# Patient Record
Sex: Female | Born: 2000 | Race: White | Hispanic: No | Marital: Single | Smoking: Never smoker
Health system: Southern US, Community
[De-identification: ages and names within clinical notes are randomized; demographics above are authoritative.]

## PROBLEM LIST (undated history)

## (undated) DIAGNOSIS — F419 Anxiety disorder, unspecified: Secondary | ICD-10-CM

## (undated) DIAGNOSIS — F909 Attention-deficit hyperactivity disorder, unspecified type: Secondary | ICD-10-CM

## (undated) HISTORY — DX: Attention-deficit hyperactivity disorder, unspecified type: F90.9

## (undated) HISTORY — DX: Anxiety disorder, unspecified: F41.9

## (undated) HISTORY — PX: IUD REMOVAL: SHX5392

---
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
## 2018-03-08 ENCOUNTER — Other Ambulatory Visit: Payer: Self-pay | Admitting: Obstetrics and Gynecology

## 2018-03-08 DIAGNOSIS — Z30011 Encounter for initial prescription of contraceptive pills: Secondary | ICD-10-CM

## 2018-06-25 ENCOUNTER — Other Ambulatory Visit: Payer: Self-pay | Admitting: Obstetrics and Gynecology

## 2018-06-25 DIAGNOSIS — Z30011 Encounter for initial prescription of contraceptive pills: Secondary | ICD-10-CM

## 2019-01-03 ENCOUNTER — Emergency Department (HOSPITAL_COMMUNITY)
Admission: EM | Admit: 2019-01-03 | Discharge: 2019-01-03 | Disposition: A | Discharge status: Home / Self Care | Payer: BC Managed Care – PPO | Attending: Emergency Medicine | Admitting: Emergency Medicine

## 2019-01-03 ENCOUNTER — Encounter (HOSPITAL_COMMUNITY): Payer: Self-pay

## 2019-01-03 ENCOUNTER — Other Ambulatory Visit: Payer: Self-pay

## 2019-01-03 ENCOUNTER — Telehealth: Payer: Self-pay | Admitting: Obstetrics and Gynecology

## 2019-01-03 DIAGNOSIS — R102 Pelvic and perineal pain: Secondary | ICD-10-CM | POA: Insufficient documentation

## 2019-01-03 DIAGNOSIS — N939 Abnormal uterine and vaginal bleeding, unspecified: Secondary | ICD-10-CM | POA: Diagnosis not present

## 2019-01-03 LAB — URINALYSIS, ROUTINE W REFLEX MICROSCOPIC
Bilirubin Urine: NEGATIVE
Glucose, UA: NEGATIVE mg/dL
Hgb urine dipstick: NEGATIVE
Ketones, ur: NEGATIVE mg/dL
Leukocytes,Ua: NEGATIVE
Nitrite: NEGATIVE
Protein, ur: NEGATIVE mg/dL
Specific Gravity, Urine: 1.006 (ref 1.005–1.030)
pH: 6 (ref 5.0–8.0)

## 2019-01-03 LAB — BASIC METABOLIC PANEL
Anion gap: 9 (ref 5–15)
BUN: 8 mg/dL (ref 6–20)
CO2: 24 mmol/L (ref 22–32)
Calcium: 9 mg/dL (ref 8.9–10.3)
Chloride: 102 mmol/L (ref 98–111)
Creatinine, Ser: 0.74 mg/dL (ref 0.44–1.00)
GFR calc Af Amer: >60 mL/min (ref >60)
GFR calc non Af Amer: >60 mL/min (ref >60)
Glucose, Bld: 85 mg/dL (ref 70–99)
Potassium: 3.4 mmol/L — ABNORMAL LOW (ref 3.5–5.1)
Sodium: 135 mmol/L (ref 135–145)

## 2019-01-03 LAB — CBC WITH DIFFERENTIAL/PLATELET
Abs Immature Granulocytes: 0.01 10*3/uL (ref 0.00–0.07)
Basophils Absolute: 0 10*3/uL (ref 0.0–0.1)
Basophils Relative: 1 %
Eosinophils Absolute: 0.3 10*3/uL (ref 0.0–0.5)
Eosinophils Relative: 4 %
HCT: 37.9 % (ref 36.0–46.0)
Hemoglobin: 12.8 g/dL (ref 12.0–15.0)
Immature Granulocytes: 0 %
Lymphocytes Relative: 28 %
Lymphs Abs: 2.2 10*3/uL (ref 0.7–4.0)
MCH: 30.5 pg (ref 26.0–34.0)
MCHC: 33.8 g/dL (ref 30.0–36.0)
MCV: 90.2 fL (ref 80.0–100.0)
Monocytes Absolute: 0.9 10*3/uL (ref 0.1–1.0)
Monocytes Relative: 11 %
Neutro Abs: 4.5 10*3/uL (ref 1.7–7.7)
Neutrophils Relative %: 56 %
Platelets: 186 10*3/uL (ref 150–400)
RBC: 4.2 MIL/uL (ref 3.87–5.11)
RDW: 12.7 % (ref 11.5–15.5)
WBC: 7.9 10*3/uL (ref 4.0–10.5)
nRBC: 0 % (ref 0.0–0.2)

## 2019-01-03 LAB — WET PREP, GENITAL
Clue Cells Wet Prep HPF POC: NONE SEEN
Sperm: NONE SEEN
Trich, Wet Prep: NONE SEEN
Yeast Wet Prep HPF POC: NONE SEEN

## 2019-01-03 LAB — I-STAT BETA HCG BLOOD, ED (MC, WL, AP ONLY): I-stat hCG, quantitative: <5 m[IU]/mL (ref <5)

## 2019-01-03 LAB — PREGNANCY, URINE: Preg Test, Ur: NEGATIVE

## 2019-01-03 NOTE — ED Triage Notes (Signed)
Pt reports in 2017 she had an IUD and reports she had to have it surgically removed.  Reports the IUD caused her to have an ovarian cyst and says has periods weekly.  Pt c/o pain in llq that started approx 1 week ago.  Reports lmp was 01/01/2019.  Reports has had multiple complications since having the IUD.

## 2019-01-03 NOTE — Telephone Encounter (Signed)
Spoke with patients mother and let her know that unfortunately at this time due to Covid [facility] is not allowing visitors. She stated that her daughter is only 18 and would not have the same questions that she would have. I told her that she was welcome to write out questions for her daughter to bring in with her to discuss with Dr. Amalia Hailey. Mother stated that Dr. Amalia Hailey surgically removed IUD and is having complications due to this. I did explain to the mother that I was not able to go into any detail about patients information due to patients mother is not on the [facility]. Patients mother verbalized understanding.

## 2019-01-03 NOTE — Telephone Encounter (Signed)
The patients mother called and stated that she is requesting to come with pt to appointment. I advised pt's mother of visitor policy. Please advise.

## 2019-01-03 NOTE — ED Provider Notes (Signed)
[facility]ENCY DEPARTMENT Provider Note   CSN: [id number] Arrival date & time: 01/03/19  1422     History   Chief Complaint Chief Complaint  Patient presents with  . Abdominal Pain    HPI Nina Choi is a 18 y.o. female.     The history is provided by the patient. No language interpreter was used.     18 year old female with history of ovarian cyst presenting for evaluation of low abdominal pain along with vaginal bleeding.  Patient report intermittent pain to her lower abdomen for the past week and also report having heavy vaginal bleeding for the past 3 days.  States that bleeding has been significant enough that she needs to change a tampon hourly basis.  Abdominal cramping felt similar to prior ovarian cyst pain.  Mild at this time.  She noticed increasing pain whenever she exerted movement of pressure.  Pain is minimal at this time.  She does not complain of any fever chills lightheadedness dizziness chest pain shortness of breath productive cough of breath, nausea or vomiting or diarrhea or dysuria.  She is currently sexually active and not using protection.  She report having an IUD placed 3 years ago and has had complications since.  She has had a surgical removal related and states it has caused menstrual perioed to be irregular.  Last sexual activities was 2 days ago and it was not painful.    Past Medical History:  Diagnosis Date  . ADHD     There are no active problems to display for this patient.   Past Surgical History:  Procedure Laterality Date  . IUD REMOVAL       OB History    Gravida  0   Para  0   Term  0   Preterm  0   AB  0   Living  0     SAB  0   TAB  0   Ectopic  0   Multiple  0   Live Births  0            Home Medications    Prior to Admission medications   Medication Sig Start Date End Date Taking? Authorizing Provider  dexmethylphenidate (FOCALIN XR) 20 MG 24 hr capsule Take 30 mg by mouth daily.     [provider]  Levonorgestrel-Ethinyl Estradiol (AMETHIA,CAMRESE) 0.15-0.03 &0.01 MG tablet Take 1 tablet at bedtime by mouth. 03/03/17   Linzie CollinEvans, David James, MD    Family History Family History  Problem Relation Age of Onset  . Diabetes Father   . Cancer Paternal Aunt   . Breast cancer Paternal Grandmother     Social History Social History   Tobacco Use  . Smoking status: Never Smoker  . Smokeless tobacco: Current User  Substance Use Topics  . Alcohol use: No  . Drug use: No     Allergies   Iodine   Review of Systems Review of Systems  All other systems reviewed and are negative.    Physical Exam Updated Vital Signs BP 116/88 (BP Location: Left Arm)   Pulse 96   Temp 98.3 F (36.8 C) (Oral)   Resp 20   Ht 5\' 1"  (1.549 m)   Wt 55.8 kg   LMP 01/01/2019   SpO2 100%   BMI 23.24 kg/m   Physical Exam Vitals signs and nursing note reviewed.  Constitutional:      General: She is not in acute distress.  Appearance: She is well-developed.  HENT:     Head: Atraumatic.  Eyes:     Conjunctiva/sclera: Conjunctivae normal.  Neck:     Musculoskeletal: Neck supple.  Cardiovascular:     Rate and Rhythm: Normal rate and regular rhythm.  Pulmonary:     Effort: Pulmonary effort is normal.     Breath sounds: Normal breath sounds.  Abdominal:     General: Abdomen is flat. Bowel sounds are normal.     Palpations: Abdomen is soft.     Tenderness: There is abdominal tenderness in the suprapubic area. There is no guarding or rebound.  Genitourinary:    Comments: Chaperone present during exam.  No inguinal lymphadenopathy or inguinal hernia noted.  Normal external genitalia.  No significant discomfort with speculum insertion.  Normal vaginal vault with small amount of vaginal bleeding noted.  Closed cervical os free of lesion or rash.  On bimanual examination, mild left adnexal tenderness without cervical motion tenderness. Skin:    Findings: No rash.   Neurological:     Mental Status: She is alert.      ED Treatments / Results  Labs (all labs ordered are listed, but only abnormal results are displayed) Labs Reviewed  WET PREP, GENITAL - Abnormal; Notable for the following components:      Result Value   WBC, Wet Prep HPF POC RARE (*)    All other components within normal limits  BASIC METABOLIC PANEL - Abnormal; Notable for the following components:   Potassium 3.4 (*)    All other components within normal limits  URINALYSIS, ROUTINE W REFLEX MICROSCOPIC  PREGNANCY, URINE  CBC WITH DIFFERENTIAL/PLATELET  RPR  HIV ANTIBODY (ROUTINE TESTING W REFLEX)  I-STAT BETA HCG BLOOD, ED ([facility], [facility], [facility] ONLY)  GC/CHLAMYDIA PROBE AMP ([place]) NOT AT [facility]    EKG None  Radiology No results found.  Procedures Procedures (including critical care time)  Medications Ordered in ED Medications - No data to display   Initial Impression / Assessment and Plan / ED Course  I have reviewed the triage vital signs and the nursing notes.  Pertinent labs & imaging results that were available during my care of the patient were reviewed by me and considered in my medical decision making (see chart for details).        BP 116/88 (BP Location: Left Arm)   Pulse 96   Temp 98.3 F (36.8 C) (Oral)   Resp 20   Ht 5\' 1"  (1.549 m)   Wt 55.8 kg   LMP 01/01/2019   SpO2 100%   BMI 23.24 kg/m    Final Clinical Impressions(s) / ED Diagnoses   Final diagnoses:  None    ED Discharge Orders    None     3:17 PM Patient here with heavy menstruation and lower abdominal pain.  She is well-appearing, vital signs stable.  Will check labs, perform Pelvic examination  4:05 PM No significant discomfort with Pap examination to suggest TOA or PID.  Pregnancy test is negative.  Normal H&H and normal WBC.  4:56 PM Labs are reassuring, normal H&H, wet prep without any concerning findings such as infection, UA negative.  At this time patient is  stable for discharge.  Suspect ovarian cyst causing abnormal uterine bleeding.  Outpatient follow-up recommended.  Return precaution discussed.   Domenic Moras, PA-C 01/06/19 8676    Margette Fast, MD 01/06/19 [phone]

## 2019-01-04 LAB — RPR: RPR Ser Ql: NONREACTIVE

## 2019-01-04 LAB — HIV ANTIBODY (ROUTINE TESTING W REFLEX): HIV Screen 4th Generation wRfx: NONREACTIVE

## 2019-01-05 LAB — GC/CHLAMYDIA PROBE AMP (~~LOC~~) NOT AT ARMC
Chlamydia: NEGATIVE
Neisseria Gonorrhea: NEGATIVE

## 2019-01-09 ENCOUNTER — Encounter: Payer: BLUE CROSS/BLUE SHIELD | Admitting: Obstetrics and Gynecology

## 2019-07-10 NOTE — Progress Notes (Deleted)
PT is present today for confirmation of pregnancy. Pt LMP  06/07/19. UPT done today results were  . Pt stated that she is doing well no complaints.

## 2019-07-11 ENCOUNTER — Encounter: Payer: Self-pay | Admitting: Obstetrics and Gynecology

## 2019-07-17 ENCOUNTER — Encounter: Payer: Self-pay | Admitting: Obstetrics and Gynecology

## 2019-08-22 ENCOUNTER — Encounter: Payer: Self-pay | Admitting: Obstetrics & Gynecology

## 2020-01-17 DIAGNOSIS — Z03818 Encounter for observation for suspected exposure to other biological agents ruled out: Secondary | ICD-10-CM | POA: Diagnosis not present

## 2020-01-17 DIAGNOSIS — Z20822 Contact with and (suspected) exposure to covid-19: Secondary | ICD-10-CM | POA: Diagnosis not present

## 2020-02-20 ENCOUNTER — Ambulatory Visit (INDEPENDENT_AMBULATORY_CARE_PROVIDER_SITE_OTHER): Payer: BC Managed Care – PPO | Admitting: Obstetrics and Gynecology

## 2020-02-20 ENCOUNTER — Encounter: Payer: Self-pay | Admitting: Obstetrics and Gynecology

## 2020-02-20 ENCOUNTER — Other Ambulatory Visit: Payer: Self-pay

## 2020-02-20 VITALS — BP 101/68 | HR 77 | Ht 61.0 in | Wt 104.0 lb

## 2020-02-20 DIAGNOSIS — R102 Pelvic and perineal pain: Secondary | ICD-10-CM | POA: Diagnosis not present

## 2020-02-20 DIAGNOSIS — N941 Unspecified dyspareunia: Secondary | ICD-10-CM | POA: Diagnosis not present

## 2020-02-20 NOTE — Progress Notes (Signed)
HPI:      [name] is a 19 y.o. G0P0000 who LMP was Patient's last menstrual period was 02/19/2020.  Subjective:   She presents today with her main complaint being intermittent pelvic pain.  She gets this pain in the midline suprapubic area.  It can happen from bending over.  It mainly occurs with intercourse and forces her to stop.  After a few minutes she can resume without the pain. She has no issues with bowel movements but she says the pain occasionally comes with urination.  She can go days at a time without the pain but never a full week. She is not currently using any form of birth control and has had unprotected intercourse for more than 2 years without resultant pregnancy.  She is concerned that she may be infertile.  Her menses are monthly and regular.  Pain is not exacerbated by her monthly menses. She reports no unusual vaginal discharge. She states that she has had the pain for more than 2 years but it is becoming worse and more frequent. She previously had an IUD that was causing her problems because it was slightly malpositioned.  She has wondered if this malpositioned IUD has caused her to have this pain or is a possible cause of her not becoming pregnant.    Hx: The following portions of the patient's history were reviewed and updated as appropriate:             She  has a past medical history of ADHD. She does not have a problem list on file. She  has a past surgical history that includes IUD removal. Her family history includes Breast cancer in her paternal grandmother; Cancer in her paternal aunt; Diabetes in her father. She  reports that she has never smoked. She uses smokeless tobacco. She reports that she does not drink alcohol and does not use drugs. She currently has no medications in their medication list. She is allergic to iodine.       Review of Systems:  Review of Systems  Constitutional: Denied constitutional symptoms, night sweats, recent illness,  fatigue, fever, insomnia and weight loss.  Eyes: Denied eye symptoms, eye pain, photophobia, vision change and visual disturbance.  Ears/Nose/Throat/Neck: Denied ear, nose, throat or neck symptoms, hearing loss, nasal discharge, sinus congestion and sore throat.  Cardiovascular: Denied cardiovascular symptoms, arrhythmia, chest pain/pressure, edema, exercise intolerance, orthopnea and palpitations.  Respiratory: Denied pulmonary symptoms, asthma, pleuritic pain, productive sputum, cough, dyspnea and wheezing.  Gastrointestinal: Denied, gastro-esophageal reflux, melena, nausea and vomiting.  Genitourinary: See HPI for additional information.  Musculoskeletal: Denied musculoskeletal symptoms, stiffness, swelling, muscle weakness and myalgia.  Dermatologic: Denied dermatology symptoms, rash and scar.  Neurologic: Denied neurology symptoms, dizziness, headache, neck pain and syncope.  Psychiatric: Denied psychiatric symptoms, anxiety and depression.  Endocrine: Denied endocrine symptoms including hot flashes and night sweats.   Meds:   No current outpatient medications on file prior to visit.   No current facility-administered medications on file prior to visit.       Upstream - 02/20/20 1003      Pregnancy Intention Screening   Does the patient want to become pregnant in the next year? Unsure    Would the patient like to discuss contraceptive options today? No      Contraception Wrap Up   Current Method No Method - Other Reason    End Method No Method - Other Reason    Contraception Counseling Provided No  The pregnancy intention screening data noted above was reviewed. Potential methods of contraception were discussed. The patient elected to proceed with No Method - No Contraceptive Precautions.     Objective:     Vitals:   02/20/20 0956  BP: 101/68  Pulse: 77   Filed Weights   02/20/20 0956  Weight: 104 lb (47.2 kg)    Abdominal examination reveals: Soft  nontender no masses.  Pain could not be reproduced.          Physical examination   Pelvic:   Vulva: Normal appearance.  No lesions.  Vagina: No lesions or abnormalities noted.  Support: Normal pelvic support.  Urethra No masses tenderness or scarring.  Meatus Normal size without lesions or prolapse.  Cervix: Normal appearance.  No lesions.  Anus: Normal exam.  No lesions.  Perineum: Normal exam.  No lesions.        Bimanual   Uterus: Normal size.  Non-tender.  Mobile.  AV.  Adnexae: No masses.  Non-tender to palpation.  Cul-de-sac: Negative for abnormality.     Assessment:    G0P0000 There are no problems to display for this patient.    1. Pelvic pain in female   2. Dyspareunia in female     No obvious cause for her pelvic pain.   Plan:            1.  Ultrasound of the pelvis  2.  Patient to present for annual examination and Pap smear  3.  Possible future work-up for infertility including female infertility if patient desires   Orders Orders Placed This Encounter  Procedures  . US PELVIS (TRANSABDOMINAL ONLY)  . US PELVIS TRANSVAGINAL NON-OB (TV ONLY)    No orders of the defined types were placed in this encounter.     F/U  Return for We will contact her with any abnormal test results. I spent 31 minutes involved in the care of this patient preparing to see the patient by obtaining and reviewing her medical history (including labs, imaging tests and prior procedures), documenting clinical information in the electronic health record (EHR), counseling and coordinating care plans, writing and sending prescriptions, ordering tests or procedures and directly communicating with the patient by discussing pertinent items from her history and physical exam as well as detailing my assessment and plan as noted above so that she has an informed understanding.  All of her questions were answered.  Elonda Husky, M.D. 02/20/2020 10:40 AM

## 2020-02-27 ENCOUNTER — Other Ambulatory Visit: Payer: BC Managed Care – PPO

## 2020-03-21 DIAGNOSIS — Z20822 Contact with and (suspected) exposure to covid-19: Secondary | ICD-10-CM | POA: Diagnosis not present

## 2020-03-21 DIAGNOSIS — Z03818 Encounter for observation for suspected exposure to other biological agents ruled out: Secondary | ICD-10-CM | POA: Diagnosis not present

## 2020-04-01 ENCOUNTER — Encounter: Payer: BC Managed Care – PPO | Admitting: Obstetrics and Gynecology

## 2020-04-01 ENCOUNTER — Telehealth: Payer: Self-pay

## 2020-04-01 NOTE — Telephone Encounter (Signed)
[facility] regarding rescheduling her AE for today.

## 2020-04-02 ENCOUNTER — Telehealth: Payer: Self-pay

## 2020-04-02 NOTE — Telephone Encounter (Signed)
mychart message sent to call and schedule an annual appointment

## 2020-04-24 ENCOUNTER — Encounter: Payer: Self-pay | Admitting: Obstetrics and Gynecology

## 2021-02-02 ENCOUNTER — Encounter: Payer: Self-pay | Admitting: Family Medicine

## 2021-02-02 ENCOUNTER — Other Ambulatory Visit: Payer: Self-pay

## 2021-02-02 ENCOUNTER — Ambulatory Visit: Payer: BC Managed Care – PPO | Admitting: Family Medicine

## 2021-02-02 VITALS — BP 92/66 | HR 62 | Temp 98.2°F | Ht 62.0 in | Wt 100.8 lb

## 2021-02-02 DIAGNOSIS — Z01419 Encounter for gynecological examination (general) (routine) without abnormal findings: Secondary | ICD-10-CM

## 2021-02-02 DIAGNOSIS — F419 Anxiety disorder, unspecified: Secondary | ICD-10-CM | POA: Insufficient documentation

## 2021-02-02 DIAGNOSIS — F909 Attention-deficit hyperactivity disorder, unspecified type: Secondary | ICD-10-CM | POA: Diagnosis not present

## 2021-02-02 DIAGNOSIS — J069 Acute upper respiratory infection, unspecified: Secondary | ICD-10-CM | POA: Insufficient documentation

## 2021-02-02 NOTE — Assessment & Plan Note (Signed)
Patient with longstanding anxiety, no previous pharmacotherapy treatments but has been to counseling, family history of bipolar disorder.  PHQ and GAD scores reviewed today, are elevated.  Patient is amenable to further evaluation and management through psychiatry, a referral was placed today.  We will follow this issue peripherally.

## 2021-02-02 NOTE — Patient Instructions (Signed)
-   Continue Mucinex - Start Flonase (fluticasone propionate) - Start over-the-counter antihistamine (Claritin, Allegra, Xyzal, etc.) - Utilize above treatments x 10 days, afterwards can use on an as-needed basis - Referral coordinator will contact you regards to follow-up with gynecology and psychiatry next steps - Return for follow-up in 2 weeks for annual physical - Contact us for questions between now and then

## 2021-02-02 NOTE — Progress Notes (Signed)
Primary Care / Sports Medicine Office Visit  Patient Information:  Patient ID: Nina Choi, female DOB: 2000/10/15 Age: 20 y.o. MRN: [id number]   Nina Choi is a pleasant 20 y.o. female presenting with the following:  Chief Complaint  Patient presents with   New Patient (Initial Visit)   Establish Care   Anxiety    Never taken medications for anxiety; history of seeing a counselor/therapist; also has ADHD and history of taking Adderall, Focalin, and Straterra, but no longer taking    Review of Systems pertinent details above   Patient Active Problem List   Diagnosis Date Noted   Viral URI with cough 02/02/2021   Anxiety 02/02/2021   Attention deficit hyperactivity disorder (ADHD) 02/02/2021   Past Medical History:  Diagnosis Date   Anxiety    Outpatient Encounter Medications as of 02/02/2021  Medication Sig   APPLE CIDER VINEGAR PO Take 1 capsule by mouth daily.   cetirizine (ZYRTEC) 10 MG tablet Take 10 mg by mouth daily as needed for allergies.   CRANBERRY CONCENTRATE PO Take 1 capsule by mouth daily.   diphenhydrAMINE HCl (BENADRYL ALLERGY PO) Take 1 tablet by mouth as needed.   Fexofenadine HCl (MUCINEX ALLERGY PO) Take 20 mLs by mouth every 6 (six) hours as needed.   No facility-administered encounter medications on file as of 02/02/2021.   Past Surgical History:  Procedure Laterality Date   INTRAUTERINE DEVICE (IUD) INSERTION  2017   IUD REMOVAL  2018   MULTIPLE TOOTH EXTRACTIONS Bilateral 2016   TYMPANOSTOMY TUBE PLACEMENT Bilateral 2007    Vitals:   02/02/21 1353  BP: 92/66  Pulse: 62  Temp: 98.2 F (36.8 C)  SpO2: 98%   Vitals:   02/02/21 1353  Weight: 100 lb 12.8 oz (45.7 kg)  Height: 5\' 2"  (1.575 m)   Body mass index is 18.44 kg/m.  No results found.   Independent interpretation of notes and tests performed by another provider:   None  Procedures performed:   None  Pertinent History, Exam, Impression, and Recommendations:    Viral URI with cough Roughly 5-day history of sinus pressure, congestion, hacking cough, throat pain, and muscle aches.  This was following a concert.  She denies any fevers, chills, has been dosing Mucinex and Benadryl alternating with Zyrtec with mild improvement.  Most symptoms have improved though main issue is with pressure, congestion, and recent ear pain.  Physical examination reveals benign oropharynx, swollen right greater than left nasal turbinate, slightly retracted tympanic membrane but otherwise canal benign, mildly tender right anterior cervical chain lymphadenopathy, clear lung fields, and benign cardiac exam.  Given her stated symptomatology and clinical course, I have advised supportive care with continued Mucinex, initiation of intranasal steroid, and transition to a different antihistamine agent.  She is to contact if symptoms persist or worsen over the next several days.  She can otherwise follow-up on an as-needed basis for this issue.  Attention deficit hyperactivity disorder (ADHD) Chronic issue for which patient has been on multiple various pharmacotherapy agents in the past.  Is interested in seeking further evaluation management for this, a referral to psychiatry was placed in that regard today.  Anxiety Patient with longstanding anxiety, no previous pharmacotherapy treatments but has been to counseling, family history of bipolar disorder.  PHQ and GAD scores reviewed today, are elevated.  Patient is amenable to further evaluation and management through psychiatry, a referral was placed today.  We will follow this issue peripherally.  Orders & Medications No orders of the defined types were placed in this encounter.  Orders Placed This Encounter  Procedures   Ambulatory referral to Psychiatry   Ambulatory referral to Gynecology     Return in about 2 weeks (around 02/16/2021) for annual physical.     Jerrol Banana, MD   Primary Care Sports  Medicine [facility]

## 2021-02-02 NOTE — Assessment & Plan Note (Signed)
Chronic issue for which patient has been on multiple various pharmacotherapy agents in the past.  Is interested in seeking further evaluation management for this, a referral to psychiatry was placed in that regard today.

## 2021-02-02 NOTE — Assessment & Plan Note (Signed)
Roughly 5-day history of sinus pressure, congestion, hacking cough, throat pain, and muscle aches.  This was following a concert.  She denies any fevers, chills, has been dosing Mucinex and Benadryl alternating with Zyrtec with mild improvement.  Most symptoms have improved though main issue is with pressure, congestion, and recent ear pain.  Physical examination reveals benign oropharynx, swollen right greater than left nasal turbinate, slightly retracted tympanic membrane but otherwise canal benign, mildly tender right anterior cervical chain lymphadenopathy, clear lung fields, and benign cardiac exam.  Given her stated symptomatology and clinical course, I have advised supportive care with continued Mucinex, initiation of intranasal steroid, and transition to a different antihistamine agent.  She is to contact us if symptoms persist or worsen over the next several days.  She can otherwise follow-up on an as-needed basis for this issue.

## 2021-02-03 ENCOUNTER — Telehealth: Payer: Self-pay

## 2021-02-03 NOTE — Telephone Encounter (Signed)
Copied from CRM [id number]. Topic: General - Other >> Feb 02, 2021  4:55 PM Marylen Ponto wrote: Reason for CRM: Pt stated she forgot to ask Dr. Ashley Royalty about completing a form for an emotional support animal so she can give it to her apartment complex. Pt requests call back. Cb# [phone]

## 2021-02-03 NOTE — Telephone Encounter (Signed)
[facility] referring for Well woman visit, pap smear. Called and left voicemail for patient to call back to be scheduled.

## 2021-02-03 NOTE — Telephone Encounter (Signed)
I advised patient during appointment that a psychiatrist/behavioral health specialist typically fills out these forms.  Please advise.

## 2021-02-03 NOTE — Telephone Encounter (Signed)
Pt called back in stating it is urgent that she gets the paper signed, for er apartments, and can not wait for her psychiatrics and asked if PCP can give her a call directly. Please advise.

## 2021-02-04 ENCOUNTER — Telehealth: Payer: Self-pay

## 2021-02-04 NOTE — Telephone Encounter (Signed)
Left message for patient notifying of her need to see a psychiatrist who can fill out paperwork for her to have an emotional support animal.  See other telephone encounter with Dr. Ashley Royalty advisement.

## 2021-02-04 NOTE — Telephone Encounter (Signed)
Copied from CRM [id number]. Topic: General - Call Back - No Documentation >> Feb 03, 2021  5:19 PM Nina Choi wrote: Reason for CRM: Pt stated she had a missed call so she was returning the call

## 2021-02-04 NOTE — Telephone Encounter (Signed)
Called and left voicemail for patient to call back to be scheduled. 

## 2021-02-06 NOTE — Telephone Encounter (Signed)
Called spoke with patient about scheduling appointment. Patient reports she will call back next week to scheduled appointment once she knows he work scheduled

## 2021-03-03 ENCOUNTER — Encounter: Payer: Self-pay | Admitting: Obstetrics and Gynecology

## 2021-03-30 ENCOUNTER — Encounter: Payer: BC Managed Care – PPO | Admitting: Advanced Practice Midwife

## 2021-05-26 ENCOUNTER — Ambulatory Visit: Payer: Self-pay | Admitting: Psychiatry

## 2022-03-31 ENCOUNTER — Ambulatory Visit (INDEPENDENT_AMBULATORY_CARE_PROVIDER_SITE_OTHER): Payer: BC Managed Care – PPO | Admitting: Family Medicine

## 2022-03-31 ENCOUNTER — Encounter: Payer: Self-pay | Admitting: Family Medicine

## 2022-03-31 VITALS — BP 110/78 | HR 74 | Ht 61.0 in | Wt 115.0 lb

## 2022-03-31 DIAGNOSIS — Z6821 Body mass index (BMI) 21.0-21.9, adult: Secondary | ICD-10-CM

## 2022-03-31 DIAGNOSIS — F419 Anxiety disorder, unspecified: Secondary | ICD-10-CM

## 2022-03-31 NOTE — Progress Notes (Signed)
     Primary Care / Sports Medicine Office Visit  Patient Information:  Patient ID: Nina Choi, female DOB: September 30, 2000 Age: 21 y.o. MRN: [id number]   Nina Choi is a pleasant 21 y.o. female presenting with the following:  Chief Complaint  Patient presents with   Form Completion    Needs an ESA form filled out    Vitals:   03/31/22 0822  BP: 110/78  Pulse: 74  SpO2: 99%   Vitals:   03/31/22 0822  Weight: 115 lb (52.2 kg)  Height: 5\' 1"  (1.549 m)   Body mass index is 21.73 kg/m.  No results found.   Independent interpretation of notes and tests performed by another provider:   None  Procedures performed:   None  Pertinent History, Exam, Impression, and Recommendations:   Problem List Items Addressed This Visit       Other   Anxiety - Primary    Chronic, well-controlled patient report.  Patient is interested in obtaining verification of emotional support animal, a referral to psychiatry has been placed and information for patient to contact group provided in my chart.  She can also contact for new referral to alternate group if she finds 1.      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  Patient expresses concern over difficulty gaining weight, does demonstrate roughly 15 pound interval weight gain over roughly 1 year.  Does relay family history (mother) with thyroid disease unspecified.  Patient oriented information provided, patient in normal BMI, will follow up this issue with lifestyle modifications until her physical during the February timeframe at which point serum studies can be obtained.        Orders & Medications No orders of the defined types were placed in this encounter.  Orders Placed This Encounter  Procedures   Ambulatory referral to Psychiatry     Return for CPE.     March, MD, [facility]   Primary Care Sports Medicine Primary Care  and Sports Medicine at [facility]

## 2022-03-31 NOTE — Assessment & Plan Note (Signed)
Chronic, well-controlled patient report.  Patient is interested in obtaining verification of emotional support animal, a referral to psychiatry has been placed and information for patient to contact group provided in my chart.  She can also contact us for new referral to alternate group if she finds 1.

## 2022-03-31 NOTE — Patient Instructions (Addendum)
-   Referral coordinator will contact you to coordinate psychiatry - Contact number below for expedited scheduling - Review information attached - Can download "my fitness pal" app for calorie tracking to utilize for weight gain - Can use protein shakes/supplements - Return February for physical  Phone: [phone] [place]

## 2022-03-31 NOTE — Assessment & Plan Note (Signed)
Chronic condition, without adequate control.  Patient expresses concern over difficulty gaining weight, does demonstrate roughly 15 pound interval weight gain over roughly 1 year.  Does relay family history (mother) with thyroid disease unspecified.  Patient oriented information provided, patient in normal BMI, will follow up this issue with lifestyle modifications until her physical during the February timeframe at which point serum studies can be obtained.

## 2022-04-01 NOTE — Telephone Encounter (Signed)
Please review.  KP

## 2022-06-15 ENCOUNTER — Encounter: Payer: Self-pay | Admitting: Family Medicine

## 2022-06-15 ENCOUNTER — Ambulatory Visit (INDEPENDENT_AMBULATORY_CARE_PROVIDER_SITE_OTHER): Payer: BC Managed Care – PPO | Admitting: Family Medicine

## 2022-06-15 VITALS — BP 110/70 | HR 98 | Ht 61.0 in | Wt 112.0 lb

## 2022-06-15 DIAGNOSIS — F419 Anxiety disorder, unspecified: Secondary | ICD-10-CM

## 2022-06-15 DIAGNOSIS — E559 Vitamin D deficiency, unspecified: Secondary | ICD-10-CM | POA: Diagnosis not present

## 2022-06-15 DIAGNOSIS — Z13220 Encounter for screening for lipoid disorders: Secondary | ICD-10-CM

## 2022-06-15 DIAGNOSIS — Z0000 Encounter for general adult medical examination without abnormal findings: Secondary | ICD-10-CM | POA: Diagnosis not present

## 2022-06-15 DIAGNOSIS — Z1159 Encounter for screening for other viral diseases: Secondary | ICD-10-CM | POA: Diagnosis not present

## 2022-06-15 DIAGNOSIS — Z118 Encounter for screening for other infectious and parasitic diseases: Secondary | ICD-10-CM | POA: Diagnosis not present

## 2022-06-15 NOTE — Patient Instructions (Signed)
-   Obtain fasting labs with orders provided (can have water or black coffee but otherwise no food or drink x 8 hours before labs) °- Review information provided °- Attend eye doctor annually, dentist every 6 months, work towards or maintain 30 minutes of moderate intensity physical activity at least 5 days per week, and consume a balanced diet °- Return in 1 year for physical °- Contact us for any questions between now and then °

## 2022-06-15 NOTE — Assessment & Plan Note (Signed)
Well-controlled, did establish with virtual therapist since last visit with benefit.

## 2022-06-15 NOTE — Progress Notes (Signed)
Annual Physical Exam Visit  Patient Information:  Patient ID: Nina Choi, female DOB: 2000-07-30 Age: 22 y.o. MRN: [id number]   Subjective:   CC: Annual Physical Exam  HPI:  Nina Choi is here for their annual physical.  I reviewed the past medical history, family history, social history, surgical history, and allergies today and changes were made as necessary.  Please see the problem list section below for additional details.  Past Medical History: Past Medical History:  Diagnosis Date   ADHD    Anxiety    Past Surgical History: Past Surgical History:  Procedure Laterality Date   INTRAUTERINE DEVICE (IUD) INSERTION  2017   IUD REMOVAL     IUD REMOVAL  2018   MULTIPLE TOOTH EXTRACTIONS Bilateral 2016   TYMPANOSTOMY TUBE PLACEMENT Bilateral 2007   Family History: Family History  Adopted: Yes  Problem Relation Age of Onset   Bipolar disorder Mother    OCD Mother    Drug abuse Mother    Breast cancer Paternal Grandmother    Skin cancer Paternal Grandmother    Heart attack Paternal Grandfather    Coronary artery disease Paternal Grandfather    Anxiety disorder Half-Sister    Diabetes Father    Cancer Paternal Aunt    Allergies: Allergies  Allergen Reactions   Iodine Hives   Iodine    Health Maintenance: Health Maintenance  Topic Date Due   COVID-19 Vaccine (1) Never done   Hepatitis C Screening  Never done   PAP-Cervical Cytology Screening  Never done   PAP SMEAR-Modifier  Never done   CHLAMYDIA SCREENING  01/03/2020   INFLUENZA VACCINE  07/25/2022 (Originally 11/24/2021)   HPV VACCINES (1 - 2-dose series) 04/01/2023 (Originally 05/05/2009)   HIV Screening  Completed   DTaP/Tdap/Td  Discontinued    HM Colonoscopy     This patient has no relevant Health Maintenance data.      Medications: Current Outpatient Medications on File Prior to Visit  Medication Sig Dispense Refill   cetirizine (ZYRTEC) 10 MG tablet Take 10 mg by mouth daily as  needed for allergies.     diphenhydrAMINE HCl (BENADRYL ALLERGY PO) Take 1 tablet by mouth as needed.     No current facility-administered medications on file prior to visit.    Review of Systems: No headache, visual changes, nausea, vomiting, diarrhea, constipation, dizziness, abdominal pain, skin rash, fevers, chills, night sweats, swollen lymph nodes, weight loss, chest pain, body aches, joint swelling, muscle aches, shortness of breath, mood changes, visual or auditory hallucinations reported.  Objective:   Vitals:   06/15/22 0821  BP: 110/70  Pulse: 98  SpO2: 99%   Vitals:   06/15/22 0821  Weight: 112 lb (50.8 kg)  Height: 5' 1"$  (1.549 m)   Body mass index is 21.16 kg/m.  General: Well Developed, well nourished, and in no acute distress.  Neuro: Alert and oriented x3, extra-ocular muscles intact, sensation grossly intact. Cranial nerves II through XII are grossly intact, motor, sensory, and coordinative functions are intact. HEENT: Normocephalic, atraumatic, pupils equal round reactive to light, neck supple, no masses, no lymphadenopathy, thyroid nonpalpable. Oropharynx, nasopharynx, external ear canals are unremarkable. Skin: Warm and dry, no rashes noted.  Cardiac: Regular rate and rhythm, no murmurs rubs or gallops. No peripheral edema. Pulses symmetric. Respiratory: Clear to auscultation bilaterally. Not using accessory muscles, speaking in full sentences.  Abdominal: Soft, nontender, nondistended, positive bowel sounds, no masses, no organomegaly. Musculoskeletal: Shoulder, elbow, wrist, hip,  knee, ankle stable, and with full range of motion.  Female chaperone initials: AH present throughout the physical examination.  Impression and Recommendations:   The patient was counselled, risk factors were discussed, and anticipatory guidance given.  Problem List Items Addressed This Visit       Other   Anxiety    Well-controlled, did establish with virtual therapist since  last visit with benefit.      Annual physical exam - Primary    Annual examination completed, risk stratification labs ordered, anticipatory guidance provided.  We will follow labs once resulted.      Relevant Orders   CBC   Comprehensive metabolic panel   Hepatitis C antibody   Lipid panel   TSH   VITAMIN D 25 Hydroxy (Vit-D Deficiency, Fractures)   GC/Chlamydia Probe Amp([facility])   Other Visit Diagnoses     Encounter for screening examination for chlamydial infection       Relevant Orders   GC/Chlamydia Probe Amp([facility])   Need for hepatitis C screening test       Relevant Orders   Hepatitis C antibody   Screening for lipoid disorders       Relevant Orders   Comprehensive metabolic panel   Lipid panel   Vitamin D deficiency       Relevant Orders   VITAMIN D 25 Hydroxy (Vit-D Deficiency, Fractures)        Orders & Medications Medications: No orders of the defined types were placed in this encounter.  Orders Placed This Encounter  Procedures   GC/Chlamydia Probe Amp([facility])   CBC   Comprehensive metabolic panel   Hepatitis C antibody   Lipid panel   TSH   VITAMIN D 25 Hydroxy (Vit-D Deficiency, Fractures)     Return in about 1 year (around 06/16/2023) for CPE.    [name], MD, [facility]   Primary Care Sports Medicine Primary Care and Sports Medicine at [facility]

## 2022-06-15 NOTE — Assessment & Plan Note (Signed)
Annual examination completed, risk stratification labs ordered, anticipatory guidance provided.  We will follow labs once resulted. 

## 2022-06-16 LAB — COMPREHENSIVE METABOLIC PANEL
ALT: 19 IU/L (ref 0–32)
AST: 20 IU/L (ref 0–40)
Albumin/Globulin Ratio: 1.8 (ref 1.2–2.2)
Albumin: 5 g/dL (ref 4.0–5.0)
Alkaline Phosphatase: 59 IU/L (ref 44–121)
BUN/Creatinine Ratio: 13 (ref 9–23)
BUN: 11 mg/dL (ref 6–20)
Bilirubin Total: 0.3 mg/dL (ref 0.0–1.2)
CO2: 23 mmol/L (ref 20–29)
Calcium: 9.5 mg/dL (ref 8.7–10.2)
Chloride: 102 mmol/L (ref 96–106)
Creatinine, Ser: 0.85 mg/dL (ref 0.57–1.00)
Globulin, Total: 2.8 g/dL (ref 1.5–4.5)
Glucose: 82 mg/dL (ref 70–99)
Potassium: 4 mmol/L (ref 3.5–5.2)
Sodium: 138 mmol/L (ref 134–144)
Total Protein: 7.8 g/dL (ref 6.0–8.5)
eGFR: 98 mL/min/{1.73_m2} (ref >59)

## 2022-06-16 LAB — CBC
Hematocrit: 43 % (ref 34.0–46.6)
Hemoglobin: 13.9 g/dL (ref 11.1–15.9)
MCH: 30.2 pg (ref 26.6–33.0)
MCHC: 32.3 g/dL (ref 31.5–35.7)
MCV: 94 fL (ref 79–97)
Platelets: 209 10*3/uL (ref 150–450)
RBC: 4.6 x10E6/uL (ref 3.77–5.28)
RDW: 12.3 % (ref 11.7–15.4)
WBC: 5.8 10*3/uL (ref 3.4–10.8)

## 2022-06-16 LAB — HEPATITIS C ANTIBODY: Hep C Virus Ab: NONREACTIVE

## 2022-06-16 LAB — LIPID PANEL
Chol/HDL Ratio: 2.2 ratio (ref 0.0–4.4)
Cholesterol, Total: 148 mg/dL (ref 100–199)
HDL: 68 mg/dL (ref >39)
LDL Chol Calc (NIH): 68 mg/dL (ref 0–99)
Triglycerides: 57 mg/dL (ref 0–149)
VLDL Cholesterol Cal: 12 mg/dL (ref 5–40)

## 2022-06-16 LAB — TSH: TSH: 1.65 u[IU]/mL (ref 0.450–4.500)

## 2022-06-16 LAB — VITAMIN D 25 HYDROXY (VIT D DEFICIENCY, FRACTURES): Vit D, 25-Hydroxy: 43.9 ng/mL (ref 30.0–100.0)

## 2022-06-17 LAB — GC/CHLAMYDIA PROBE AMP
Chlamydia trachomatis, NAA: NEGATIVE
Neisseria Gonorrhoeae by PCR: NEGATIVE

## 2022-07-11 ENCOUNTER — Ambulatory Visit
Admission: EM | Admit: 2022-07-11 | Discharge: 2022-07-11 | Disposition: A | Discharge status: Home / Self Care | Payer: BC Managed Care – PPO | Attending: Family Medicine | Admitting: Family Medicine

## 2022-07-11 ENCOUNTER — Encounter: Payer: Self-pay | Admitting: Emergency Medicine

## 2022-07-11 DIAGNOSIS — J029 Acute pharyngitis, unspecified: Secondary | ICD-10-CM | POA: Diagnosis not present

## 2022-07-11 LAB — GROUP A STREP BY PCR: Group A Strep by PCR: NOT DETECTED

## 2022-07-11 NOTE — ED Triage Notes (Signed)
Patient c/o sore throat that started 2 days ago.  Patient denies fevers.

## 2022-07-11 NOTE — Discharge Instructions (Signed)
I will call you if your strep test is positive.    You can take Tylenol and/or Ibuprofen as needed for fever reduction and pain relief.    For cough: honey 1/2 to 1 teaspoon (you can dilute the honey in water or another fluid).  You can also use guaifenesin and dextromethorphan for cough. You can use a humidifier for chest congestion and cough.  If you don't have a humidifier, you can sit in the bathroom with the hot shower running.      For sore throat: try warm salt water gargles, Mucinex sore throat cough drops or cepacol lozenges, throat spray, warm tea or water with lemon/honey, popsicles or ice, or OTC cold relief medicine for throat discomfort. You can also purchase chloraseptic spray at the pharmacy or dollar store.   For congestion: take a daily anti-histamine like Zyrtec, Claritin, and a oral decongestant, such as pseudoephedrine.  You can also use Flonase 1-2 sprays in each nostril daily. Afrin is also a good option, if you do not have high blood pressure.    It is important to stay hydrated: drink plenty of fluids (water, gatorade/powerade/pedialyte, juices, or teas) to keep your throat moisturized and help further relieve irritation/discomfort.    Return or go to the Emergency Department if symptoms worsen or do not improve in the next few days

## 2022-07-11 NOTE — ED Provider Notes (Signed)
[facility]    CSN: [id number] Arrival date & time: 07/11/22  1335      History   Chief Complaint Chief Complaint  Patient presents with   Sore Throat    HPI Nina Choi is a 22 y.o. female.   HPI   Nina Choi presents for sore throat that started on Friday. There is some "ick" that is going around work. She was able to go to the gym. This morning, woke up "feeling like death." Has persistent sore throat, coughing and nasal congestion. No vomiting, shortness of breath, chest pain, diarrhea or abdominal pain.     Past Medical History:  Diagnosis Date   ADHD    Anxiety     Patient Active Problem List   Diagnosis Date Noted   Annual physical exam 06/15/2022   BMI 21.0-21.9, adult 03/31/2022   Anxiety 02/02/2021   Attention deficit hyperactivity disorder (ADHD) 02/02/2021    Past Surgical History:  Procedure Laterality Date   INTRAUTERINE DEVICE (IUD) INSERTION  2017   IUD REMOVAL     IUD REMOVAL  2018   MULTIPLE TOOTH EXTRACTIONS Bilateral 2016   TYMPANOSTOMY TUBE PLACEMENT Bilateral 2007    OB History   No obstetric history on file.      Home Medications    Prior to Admission medications   Medication Sig Start Date End Date Taking? Authorizing Provider  cetirizine (ZYRTEC) 10 MG tablet Take 10 mg by mouth daily as needed for allergies.    [provider]  diphenhydrAMINE HCl (BENADRYL ALLERGY PO) Take 1 tablet by mouth as needed.    [provider]    Family History Family History  Adopted: Yes  Problem Relation Age of Onset   Bipolar disorder Mother    OCD Mother    Drug abuse Mother    Breast cancer Paternal Grandmother    Skin cancer Paternal Grandmother    Heart attack Paternal Grandfather    Coronary artery disease Paternal Grandfather    Anxiety disorder Half-Sister    Diabetes Father    Cancer Paternal Aunt     Social History Social History   Tobacco Use   Smoking status: Never   Smokeless tobacco:  Never  Vaping Use   Vaping Use: Every day  Substance Use Topics   Alcohol use: Not Currently   Drug use: Not Currently     Allergies   Iodine and Iodine   Review of Systems Review of Systems: negative unless otherwise stated in HPI.      Physical Exam Triage Vital Signs ED Triage Vitals  Enc Vitals Group     BP 07/11/22 1526 107/72     Pulse Rate 07/11/22 1526 75     Resp 07/11/22 1526 14     Temp 07/11/22 1526 98.2 F (36.8 C)     Temp Source 07/11/22 1526 Oral     SpO2 07/11/22 1526 100 %     Weight 07/11/22 1524 111 lb 15.9 oz (50.8 kg)     Height 07/11/22 1524 5\' 2"  (1.575 m)     Head Circumference --      Peak Flow --      Pain Score 07/11/22 1524 4     Pain Loc --      Pain Edu? --      Excl. in [place]? --    No data found.  Updated Vital Signs BP 107/72 (BP Location: Left Arm)   Pulse 75   Temp 98.2 F (36.8  C) (Oral)   Resp 14   Ht 5\' 2"  (1.575 m)   Wt 50.8 kg   LMP 06/20/2022   SpO2 100%   BMI 20.48 kg/m   Visual Acuity Right Eye Distance:   Left Eye Distance:   Bilateral Distance:    Right Eye Near:   Left Eye Near:    Bilateral Near:     Physical Exam GEN:     alert, non-toxic appearing female in no distress ***   HENT:  mucus membranes moist, oropharyngeal ***without lesions or ***exudate, no*** tonsillar hypertrophy, *** mild oropharyngeal erythema , *** moderate erythematous edematous turbinates, ***clear nasal discharge, ***bilateral TM normal EYES:   pupils equal and reactive, ***no scleral injection or discharge NECK:  normal ROM, no ***lymphadenopathy, ***no meningismus   RESP:  no increased work of breathing, ***clear to auscultation bilaterally CVS:   regular rate ***and rhythm Skin:   warm and dry, no rash on visible skin***    UC Treatments / Results  Labs (all labs ordered are listed, but only abnormal results are displayed) Labs Reviewed  GROUP A STREP BY PCR    EKG   Radiology No results  found.  Procedures Procedures (including critical care time)  Medications Ordered in UC Medications - No data to display  Initial Impression / Assessment and Plan / UC Course  I have reviewed the triage vital signs and the nursing notes.  Pertinent labs & imaging results that were available during my care of the patient were reviewed by me and considered in my medical decision making (see chart for details).       Pt is a 23 y.o. female who presents for *** days of respiratory symptoms. [place] is ***afebrile here without recent antipyretics. Satting well on room air. Overall pt is ***non-toxic appearing, well hydrated, without respiratory distress. Pulmonary exam ***is unremarkable.  COVID and influenza testing obtained ***and was negative. ***Pt to quarantine until COVID test results or longer if positive.  I will call patient with test results, if positive. History consistent with ***viral respiratory illness. Discussed symptomatic treatment.  Explained lack of efficacy of antibiotics in viral disease.  Typical duration of symptoms discussed.   Return and ED precautions given and voiced understanding. Discussed MDM, treatment plan and plan for follow-up with patient/guardian*** who agrees with plan.     Final Clinical Impressions(s) / UC Diagnoses   Final diagnoses:  None   Discharge Instructions   None    ED Prescriptions   None    PDMP not reviewed this encounter.

## 2022-08-02 ENCOUNTER — Encounter: Payer: Self-pay | Admitting: Family Medicine

## 2022-08-02 ENCOUNTER — Ambulatory Visit (INDEPENDENT_AMBULATORY_CARE_PROVIDER_SITE_OTHER): Payer: BC Managed Care – PPO | Admitting: Family Medicine

## 2022-08-02 VITALS — BP 108/78 | HR 60 | Ht 62.0 in | Wt 110.0 lb

## 2022-08-02 DIAGNOSIS — J45909 Unspecified asthma, uncomplicated: Secondary | ICD-10-CM

## 2022-08-02 MED ORDER — BUDESONIDE-FORMOTEROL FUMARATE 80-4.5 MCG/ACT IN AERO: 2.0000 | INHALATION_SPRAY | Freq: Two times a day (BID) | RESPIRATORY_TRACT | 3 refills | Status: AC

## 2022-08-02 MED ORDER — ALBUTEROL SULFATE HFA 108 (90 BASE) MCG/ACT IN AERS: 1.0000 | INHALATION_SPRAY | Freq: Four times a day (QID) | RESPIRATORY_TRACT | 2 refills | Status: DC | PRN

## 2022-08-02 NOTE — Patient Instructions (Addendum)
-   Use Symbicort 2 puffs twice daily - Use albuterol as needed - Can consider Wellbutrin for vaping cessation - Contact for questions

## 2022-08-02 NOTE — Assessment & Plan Note (Signed)
History of chronic allergies, progressive respiratory component with season change, has previously utilized albuterol with relief.  Has been attempting cut back on vaping.  Examination with clear lung fields throughout, no wheezes, rales, rhonchi noted.  Plan as follows: - Rx as needed albuterol - Rx daily Symbicort, can consider seasonal usage

## 2022-08-02 NOTE — Progress Notes (Signed)
     Primary Care / Sports Medicine Office Visit  Patient Information:  Patient ID: Nina Choi, female DOB: 2001/01/05 Age: 22 y.o. MRN: [id number]   Nina Choi is a pleasant 22 y.o. female presenting with the following:  Chief Complaint  Patient presents with   exercising issues    Harder to breathe while working out, coughing, does vape, last summer tried albuterol inhaler and it helped during pollen season     Vitals:   08/02/22 1434  BP: 108/78  Pulse: 60  SpO2: 98%   Vitals:   08/02/22 1434  Weight: 110 lb (49.9 kg)  Height: 5\' 2"  (1.575 m)   Body mass index is 20.12 kg/m.  No results found.   Independent interpretation of notes and tests performed by another provider:   None  Procedures performed:   None  Pertinent History, Exam, Impression, and Recommendations:   Nina Choi was seen today for exercising issues.  Bronchitis, allergic, unspecified asthma severity, uncomplicated Assessment & Plan: History of chronic allergies, progressive respiratory component with season change, has previously utilized albuterol with relief.  Has been attempting cut back on vaping.  Examination with clear lung fields throughout, no wheezes, rales, rhonchi noted.  Plan as follows: - Rx as needed albuterol - Rx daily Symbicort, can consider seasonal usage  Orders: -     Albuterol Sulfate HFA; Inhale 1-2 puffs into the lungs every 6 (six) hours as needed for wheezing or shortness of breath.  Dispense: 8 g; Refill: 2 -     Budesonide-Formoterol Fumarate; Inhale 2 puffs into the lungs 2 (two) times daily.  Dispense: 1 each; Refill: 3     Orders & Medications Meds ordered this encounter  Medications   albuterol (VENTOLIN HFA) 108 (90 Base) MCG/ACT inhaler    Sig: Inhale 1-2 puffs into the lungs every 6 (six) hours as needed for wheezing or shortness of breath.    Dispense:  8 g    Refill:  2   budesonide-formoterol (SYMBICORT) 80-4.5 MCG/ACT inhaler    Sig:  Inhale 2 puffs into the lungs 2 (two) times daily.    Dispense:  1 each    Refill:  3   No orders of the defined types were placed in this encounter.    Return in about 10 months (around 06/17/2023) for CPE.     Jerrol Banana, MD, [facility]   Primary Care Sports Medicine Primary Care and Sports Medicine at [facility]

## 2022-09-09 ENCOUNTER — Telehealth: Payer: BC Managed Care – PPO

## 2022-09-09 ENCOUNTER — Telehealth: Payer: BC Managed Care – PPO | Admitting: Physician Assistant

## 2022-09-09 DIAGNOSIS — B3731 Acute candidiasis of vulva and vagina: Secondary | ICD-10-CM | POA: Diagnosis not present

## 2022-09-09 MED ORDER — FLUCONAZOLE 150 MG PO TABS: 150.0000 mg | ORAL_TABLET | ORAL | 0 refills | Status: DC | PRN

## 2022-09-09 NOTE — Patient Instructions (Signed)
Nina Choi, thank you for joining Margaretann Loveless, PA-C for today's virtual visit.  While this provider is not your primary care provider (PCP), if your PCP is located in our provider database this encounter information will be shared with them immediately following your visit.   A [facility] MyChart account gives you access to today's visit and all your visits, tests, and labs performed at [facility] " click here if you don't have a [facility] MyChart account or go to [email or web address]  Consent: (Patient) Nina Choi provided verbal consent for this virtual visit at the beginning of the encounter.  Current Medications:  Current Outpatient Medications:    fluconazole (DIFLUCAN) 150 MG tablet, Take 1 tablet (150 mg total) by mouth every 3 (three) days as needed., Disp: 2 tablet, Rfl: 0   albuterol (VENTOLIN HFA) 108 (90 Base) MCG/ACT inhaler, Inhale 1-2 puffs into the lungs every 6 (six) hours as needed for wheezing or shortness of breath., Disp: 8 g, Rfl: 2   budesonide-formoterol (SYMBICORT) 80-4.5 MCG/ACT inhaler, Inhale 2 puffs into the lungs 2 (two) times daily., Disp: 1 each, Rfl: 3   cetirizine (ZYRTEC) 10 MG tablet, Take 10 mg by mouth daily as needed for allergies., Disp: , Rfl:    diphenhydrAMINE HCl (BENADRYL ALLERGY PO), Take 1 tablet by mouth as needed., Disp: , Rfl:    Medications ordered in this encounter:  Meds ordered this encounter  Medications   fluconazole (DIFLUCAN) 150 MG tablet    Sig: Take 1 tablet (150 mg total) by mouth every 3 (three) days as needed.    Dispense:  2 tablet    Refill:  0    Order Specific Question:   Supervising Provider    Answer:   Merrilee Jansky [id number]     *If you need refills on other medications prior to your next appointment, please contact your pharmacy*  Follow-Up: Call back or seek an in-person evaluation if the symptoms worsen or if the condition fails to improve as anticipated.  [facility] [phone]  Other Instructions Vaginal Yeast Infection, Adult  Vaginal yeast infection is a condition that causes vaginal discharge as well as soreness, swelling, and redness (inflammation) of the vagina. This is a common condition. Some women get this infection frequently. What are the causes? This condition is caused by a change in the normal balance of the yeast (Candida) and normal bacteria that live in the vagina. This change causes an overgrowth of yeast, which causes the inflammation. What increases the risk? The condition is more likely to develop in women who: Take antibiotic medicines. Have diabetes. Take birth control pills. Are pregnant. Douche often. Have a weak body defense system (immune system). Have been taking steroid medicines for a long time. Frequently wear tight clothing. What are the signs or symptoms? Symptoms of this condition include: White, thick, creamy vaginal discharge. Swelling, itching, redness, and irritation of the vagina. The lips of the vagina (labia) may be affected as well. Pain or a burning feeling while urinating. Pain during sex. How is this diagnosed? This condition is diagnosed based on: Your medical history. A physical exam. A pelvic exam. Your health care provider will examine a sample of your vaginal discharge under a microscope. Your health care provider may send this sample for testing to confirm the diagnosis. How is this treated? This condition is treated with medicine. Medicines may be over-the-counter or prescription. You may be told to use one or more  of the following: Medicine that is taken by mouth (orally). Medicine that is applied as a cream (topically). Medicine that is inserted directly into the vagina (suppository). Follow these instructions at home: Take or apply over-the-counter and prescription medicines only as told by your health care provider. Do not use tampons until your health care  provider approves. Do not have sex until your infection has cleared. Sex can prolong or worsen your symptoms of infection. Ask your health care provider when it is safe to resume sexual activity. Keep all follow-up visits. This is important. How is this prevented?  Do not wear tight clothes, such as pantyhose or tight pants. Wear breathable cotton underwear. Do not use douches, perfumed soap, creams, or powders. Wipe from front to back after using the toilet. If you have diabetes, keep your blood sugar levels under control. Ask your health care provider for other ways to prevent yeast infections. Contact a health care provider if: You have a fever. Your symptoms go away and then return. Your symptoms do not get better with treatment. Your symptoms get worse. You have new symptoms. You develop blisters in or around your vagina. You have blood coming from your vagina and it is not your menstrual period. You develop pain in your abdomen. Summary Vaginal yeast infection is a condition that causes discharge as well as soreness, swelling, and redness (inflammation) of the vagina. This condition is treated with medicine. Medicines may be over-the-counter or prescription. Take or apply over-the-counter and prescription medicines only as told by your health care provider. Do not douche. Resume sexual activity or use of tampons as instructed by your health care provider. Contact a health care provider if your symptoms do not get better with treatment or your symptoms go away and then return. This information is not intended to replace advice given to you by your health care provider. Make sure you discuss any questions you have with your health care provider. Document Revised: 06/30/2020 Document Reviewed: 06/30/2020 Elsevier Patient Education  2023 Elsevier Inc.    If you have been instructed to have an in-person evaluation today at a local Urgent Care facility, please use the link below. It  will take you to a list of all of our available [facility], including address, phone number and hours of operation. Please do not delay care.  [facility]  If you or a family member do not have a primary care provider, use the link below to schedule a visit and establish care. When you choose a [facility] primary care physician or advanced practice provider, you gain a long-term partner in health. Find a Primary Care Provider  Learn more about [facility]'s in-office and virtual care options: [facility] - Get Care Now

## 2022-09-09 NOTE — Progress Notes (Signed)
Virtual Visit Consent   [name], you are scheduled for a virtual visit with a [facility] provider today. Just as with appointments in the office, your consent must be obtained to participate. Your consent will be active for this visit and any virtual visit you may have with one of our providers in the next 365 days. If you have a MyChart account, a copy of this consent can be sent to you electronically.  As this is a virtual visit, video technology does not allow for your provider to perform a traditional examination. This may limit your provider's ability to fully assess your condition. If your provider identifies any concerns that need to be evaluated in person or the need to arrange testing (such as labs, EKG, etc.), we will make arrangements to do so. Although advances in technology are sophisticated, we cannot ensure that it will always work on either your end or our end. If the connection with a video visit is poor, the visit may have to be switched to a telephone visit. With either a video or telephone visit, we are not always able to ensure that we have a secure connection.  By engaging in this virtual visit, you consent to the provision of healthcare and authorize for your insurance to be billed (if applicable) for the services provided during this visit. Depending on your insurance coverage, you may receive a charge related to this service.  I need to obtain your verbal consent now. Are you willing to proceed with your visit today? [name] has provided verbal consent on 09/09/2022 for a virtual visit (video or telephone). Margaretann Loveless, PA-C  Date: 09/09/2022 5:49 PM  Virtual Visit via Video Note   I, Margaretann Loveless, connected with  Nina Choi  ([id number], 2001-04-16) on 09/09/22 at  5:45 PM EDT by a video-enabled telemedicine application and verified that I am speaking with the correct person using two identifiers.  Location: Patient: Virtual Visit  Location Patient: Home Provider: Virtual Visit Location Provider: Home Office   I discussed the limitations of evaluation and management by telemedicine and the availability of in person appointments. The patient expressed understanding and agreed to proceed.    History of Present Illness: Nina Choi is a 22 y.o. who identifies as a female who was assigned female at birth, and is being seen today for vaginal discharge.  HPI: Vaginal Discharge The patient's primary symptoms include genital itching and vaginal discharge. This is a new problem. The current episode started in the past 7 days (Sunday; had went to a pool and was in a bathing suit all day). The problem has been gradually worsening. The patient is experiencing no pain. She is not pregnant. Pertinent negatives include no back pain, chills, fever, flank pain, frequency, headaches, hematuria, nausea, painful intercourse, sore throat or urgency. The vaginal discharge was white, thick and yellow. She has tried antifungals for the symptoms. The treatment provided mild relief.     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   Annual physical exam 06/15/2022   BMI 21.0-21.9, adult 03/31/2022   Anxiety 02/02/2021   Attention deficit hyperactivity disorder (ADHD) 02/02/2021    Allergies:  Allergies  Allergen Reactions   Iodine Hives   Iodine    Medications:  Current Outpatient Medications:    fluconazole (DIFLUCAN) 150 MG tablet, Take 1 tablet (150 mg total) by mouth every 3 (three) days as needed., Disp: 2  tablet, Rfl: 0   albuterol (VENTOLIN HFA) 108 (90 Base) MCG/ACT inhaler, Inhale 1-2 puffs into the lungs every 6 (six) hours as needed for wheezing or shortness of breath., Disp: 8 g, Rfl: 2   budesonide-formoterol (SYMBICORT) 80-4.5 MCG/ACT inhaler, Inhale 2 puffs into the lungs 2 (two) times daily., Disp: 1 each, Rfl: 3   cetirizine (ZYRTEC) 10 MG tablet, Take 10 mg by mouth  daily as needed for allergies., Disp: , Rfl:    diphenhydrAMINE HCl (BENADRYL ALLERGY PO), Take 1 tablet by mouth as needed., Disp: , Rfl:   Observations/Objective: Patient is well-developed, well-nourished in no acute distress.  Resting comfortably  at home.  Head is normocephalic, atraumatic.  No labored breathing.  Speech is clear and coherent with logical content.  Patient is alert and oriented at baseline.    Assessment and Plan: 1. Yeast vaginitis - fluconazole (DIFLUCAN) 150 MG tablet; Take 1 tablet (150 mg total) by mouth every 3 (three) days as needed.  Dispense: 2 tablet; Refill: 0  - Symptoms consistent with Yeast vaginitis - Fluconazole prescribed - Limit bubble baths, scented lotions/soaps/detergents - Limit tight fitting clothing - Seek on person evaluation if not improving or if symptoms worsen   Follow Up Instructions: I discussed the assessment and treatment plan with the patient. The patient was provided an opportunity to ask questions and all were answered. The patient agreed with the plan and demonstrated an understanding of the instructions.  A copy of instructions were sent to the patient via MyChart unless otherwise noted below.    The patient was advised to call back or seek an in-person evaluation if the symptoms worsen or if the condition fails to improve as anticipated.  Time:  I spent 8 minutes with the patient via telehealth technology discussing the above problems/concerns.    Margaretann Loveless, PA-C

## 2023-03-14 ENCOUNTER — Emergency Department
Admission: EM | Admit: 2023-03-14 | Discharge: 2023-03-14 | Disposition: A | Discharge status: Home / Self Care | Payer: BC Managed Care – PPO | Attending: Emergency Medicine | Admitting: Emergency Medicine

## 2023-03-14 ENCOUNTER — Encounter: Payer: BC Managed Care – PPO | Admitting: Family Medicine

## 2023-03-14 ENCOUNTER — Encounter: Payer: Self-pay | Admitting: Family Medicine

## 2023-03-14 ENCOUNTER — Other Ambulatory Visit: Payer: Self-pay

## 2023-03-14 DIAGNOSIS — R1032 Left lower quadrant pain: Secondary | ICD-10-CM | POA: Insufficient documentation

## 2023-03-14 DIAGNOSIS — R1031 Right lower quadrant pain: Secondary | ICD-10-CM | POA: Diagnosis not present

## 2023-03-14 DIAGNOSIS — Z3202 Encounter for pregnancy test, result negative: Secondary | ICD-10-CM | POA: Insufficient documentation

## 2023-03-14 DIAGNOSIS — Z3A01 Less than 8 weeks gestation of pregnancy: Secondary | ICD-10-CM | POA: Diagnosis not present

## 2023-03-14 DIAGNOSIS — R102 Pelvic and perineal pain: Secondary | ICD-10-CM | POA: Insufficient documentation

## 2023-03-14 DIAGNOSIS — O26891 Other specified pregnancy related conditions, first trimester: Secondary | ICD-10-CM | POA: Diagnosis not present

## 2023-03-14 LAB — CBC
HCT: 37.6 % (ref 36.0–46.0)
Hemoglobin: 12.8 g/dL (ref 12.0–15.0)
MCH: 30.5 pg (ref 26.0–34.0)
MCHC: 34 g/dL (ref 30.0–36.0)
MCV: 89.5 fL (ref 80.0–100.0)
Platelets: 216 10*3/uL (ref 150–400)
RBC: 4.2 MIL/uL (ref 3.87–5.11)
RDW: 12.6 % (ref 11.5–15.5)
WBC: 5.6 10*3/uL (ref 4.0–10.5)
nRBC: 0 % (ref 0.0–0.2)

## 2023-03-14 LAB — URINALYSIS, ROUTINE W REFLEX MICROSCOPIC
Bacteria, UA: NONE SEEN
Bilirubin Urine: NEGATIVE
Glucose, UA: NEGATIVE mg/dL
Ketones, ur: NEGATIVE mg/dL
Leukocytes,Ua: NEGATIVE
Nitrite: NEGATIVE
Protein, ur: NEGATIVE mg/dL
Specific Gravity, Urine: 1.002 — ABNORMAL LOW (ref 1.005–1.030)
WBC, UA: 0 WBC/hpf (ref 0–5)
pH: 7 (ref 5.0–8.0)

## 2023-03-14 LAB — COMPREHENSIVE METABOLIC PANEL
ALT: 14 U/L (ref 0–44)
AST: 14 U/L — ABNORMAL LOW (ref 15–41)
Albumin: 4.3 g/dL (ref 3.5–5.0)
Alkaline Phosphatase: 45 U/L (ref 38–126)
Anion gap: 8 (ref 5–15)
BUN: 12 mg/dL (ref 6–20)
CO2: 24 mmol/L (ref 22–32)
Calcium: 9.3 mg/dL (ref 8.9–10.3)
Chloride: 107 mmol/L (ref 98–111)
Creatinine, Ser: 0.59 mg/dL (ref 0.44–1.00)
GFR, Estimated: >60 mL/min (ref >60)
Glucose, Bld: 102 mg/dL — ABNORMAL HIGH (ref 70–99)
Potassium: 3.8 mmol/L (ref 3.5–5.1)
Sodium: 139 mmol/L (ref 135–145)
Total Bilirubin: 0.3 mg/dL (ref <1.2)
Total Protein: 7.4 g/dL (ref 6.5–8.1)

## 2023-03-14 LAB — HCG, QUANTITATIVE, PREGNANCY: hCG, Beta Chain, Quant, S: <1 m[IU]/mL (ref <5)

## 2023-03-14 LAB — POC URINE PREG, ED: Preg Test, Ur: NEGATIVE

## 2023-03-14 NOTE — ED Triage Notes (Signed)
Pt here with pelvic pain since yesterday. Pt states she just found out she was pregnant recently and had a bit of spotting. Pt states the pain and bleeding has gotten much worse. Pt denies NVD.

## 2023-03-14 NOTE — Progress Notes (Signed)
Positive home pregnancy test, abdominal pain/cramping, and vaginal bleeding -advised ER follow-up today.  Cancel visit

## 2023-03-14 NOTE — ED Provider Notes (Signed)
[facility] Provider Note    Event Date/Time   First MD Initiated Contact with Patient 03/14/23 1107     (approximate)   History   Pelvic Pain   HPI  Nina Choi is a 22 y.o. female with history of ADHD and anxiety presents emergency department with concerns of pelvic pain.  Patient states she had positive pregnancy test about 5 days ago.  LMP was October 20.  States then she started having pelvic pain and bleeding.  No fever or chills.  Patient states she wears tampons and is still using the same tampon she put in at 9 AM.  States is not going through more than 1/h.      Physical Exam   Triage Vital Signs: ED Triage Vitals  Encounter Vitals Group     BP 03/14/23 1037 115/76     Systolic BP Percentile --      Diastolic BP Percentile --      Pulse Rate 03/14/23 1037 78     Resp 03/14/23 1037 16     Temp 03/14/23 1037 98.4 F (36.9 C)     Temp Source 03/14/23 1037 Oral     SpO2 03/14/23 1037 100 %     Weight 03/14/23 1038 121 lb 0.5 oz (54.9 kg)     Height 03/14/23 1038 5\' 2"  (1.575 m)     Head Circumference --      Peak Flow --      Pain Score 03/14/23 1038 5     Pain Loc --      Pain Education --      Exclude from Growth Chart --     Most recent vital signs: Vitals:   03/14/23 1037  BP: 115/76  Pulse: 78  Resp: 16  Temp: 98.4 F (36.9 C)  SpO2: 100%     General: Awake, no distress.   CV:  Good peripheral perfusion. regular rate and  rhythm Resp:  Normal effort.  Abd:  No distention.  Mildly tender lower quads bilaterally Other:      ED Results / Procedures / Treatments   Labs (all labs ordered are listed, but only abnormal results are displayed) Labs Reviewed  COMPREHENSIVE METABOLIC PANEL - Abnormal; Notable for the following components:      Result Value   Glucose, Bld 102 (*)    AST 14 (*)    All other components within normal limits  URINALYSIS, ROUTINE W REFLEX MICROSCOPIC - Abnormal; Notable for the  following components:   Color, Urine COLORLESS (*)    APPearance CLEAR (*)    Specific Gravity, Urine 1.002 (*)    Hgb urine dipstick SMALL (*)    All other components within normal limits  POC URINE PREG, ED - Normal  CBC  HCG, QUANTITATIVE, PREGNANCY     EKG     RADIOLOGY     PROCEDURES:   Procedures   MEDICATIONS ORDERED IN ED: Medications - No data to display   IMPRESSION / MDM / ASSESSMENT AND PLAN / ED COURSE  I reviewed the triage vital signs and the nursing notes.                              Differential diagnosis includes, but is not limited to, false positive pregnancy test, menstruation, miscarriage, threatened miscarriage, ectopic  Patient's presentation is most consistent with acute illness / injury with system symptoms.   Labs are reassuring,  POC pregnancy is negative, will await beta-hCG   Beta-hCG is reassuring, less than 1  I did explain findings to patient.  If she had been pregnant and just recently started bleeding the beta-hCG would still be elevated.  Did explain this to her.  She is to follow-up with GYN.  If she has concerns of getting pregnant and not wanting to be pregnant I encouraged her to call her doctor and get started on birth control.  She is in agreement treatment plan.  Discharged stable condition.   FINAL CLINICAL IMPRESSION(S) / ED DIAGNOSES   Final diagnoses:  Negative pregnancy test  Pelvic pain in female     Rx / DC Orders   ED Discharge Orders     None        Note:  This document was prepared using Dragon voice recognition software and may include unintentional dictation errors.    Faythe Ghee, PA-C 03/14/23 1206    Jene Every, MD 03/14/23 [phone]

## 2023-09-23 ENCOUNTER — Telehealth: Admitting: Family Medicine

## 2023-09-23 DIAGNOSIS — F419 Anxiety disorder, unspecified: Secondary | ICD-10-CM

## 2023-09-23 DIAGNOSIS — F410 Panic disorder [episodic paroxysmal anxiety] without agoraphobia: Secondary | ICD-10-CM | POA: Diagnosis not present

## 2023-09-23 MED ORDER — HYDROXYZINE PAMOATE 25 MG PO CAPS: 25.0000 mg | ORAL_CAPSULE | Freq: Three times a day (TID) | ORAL | 0 refills | Status: AC | PRN

## 2023-09-23 NOTE — Progress Notes (Signed)
 Virtual Visit Consent   Nina Choi, you are scheduled for a virtual visit with a [facility] provider today. Just as with appointments in the office, your consent must be obtained to participate. Your consent will be active for this visit and any virtual visit you may have with one of our providers in the next 365 days. If you have a MyChart account, a copy of this consent can be sent to you electronically.  As this is a virtual visit, video technology does not allow for your provider to perform a traditional examination. This may limit your provider's ability to fully assess your condition. If your provider identifies any concerns that need to be evaluated in person or the need to arrange testing (such as labs, EKG, etc.), we will make arrangements to do so. Although advances in technology are sophisticated, we cannot ensure that it will always work on either your end or our end. If the connection with a video visit is poor, the visit may have to be switched to a telephone visit. With either a video or telephone visit, we are not always able to ensure that we have a secure connection.  By engaging in this virtual visit, you consent to the provision of healthcare and authorize for your insurance to be billed (if applicable) for the services provided during this visit. Depending on your insurance coverage, you may receive a charge related to this service.  I need to obtain your verbal consent now. Are you willing to proceed with your visit today? [name] has provided verbal consent on 09/23/2023 for a virtual visit (video or telephone). [name], FNP  Date: 09/23/2023 2:53 PM   Virtual Visit via Video Note   I, [name], connected with  Nina Choi  ([id number], January 22, 2001) on 09/23/23 at  2:45 PM EDT by a video-enabled telemedicine application and verified that I am speaking with the correct person using two identifiers.  Location: Patient: Virtual Visit Location Patient:  Home Provider: Virtual Visit Location Provider: Home Office   I discussed the limitations of evaluation and management by telemedicine and the availability of in person appointments. The patient expressed understanding and agreed to proceed.    History of Present Illness: Nina Choi is a 23 y.o. who identifies as a female who was assigned female at birth, and is being seen today for increased anxiety and panic attacks. Having been dx with bipolar and anxiety as a child off meds since high school. Denies suicidal thoughts. 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   Annual physical exam 06/15/2022   BMI 21.0-21.9, adult 03/31/2022   Anxiety 02/02/2021   Attention deficit hyperactivity disorder (ADHD) 02/02/2021    Allergies:  Allergies  Allergen Reactions   Iodine Hives   Iodine    Medications:  Current Outpatient Medications:    albuterol  (VENTOLIN  HFA) 108 (90 Base) MCG/ACT inhaler, Inhale 1-2 puffs into the lungs every 6 (six) hours as needed for wheezing or shortness of breath., Disp: 8 g, Rfl: 2   budesonide -formoterol  (SYMBICORT ) 80-4.5 MCG/ACT inhaler, Inhale 2 puffs into the lungs 2 (two) times daily., Disp: 1 each, Rfl: 3   cetirizine (ZYRTEC) 10 MG tablet, Take 10 mg by mouth daily as needed for allergies., Disp: , Rfl:    diphenhydrAMINE HCl (BENADRYL ALLERGY PO), Take 1 tablet by mouth as needed., Disp: , Rfl:   Observations/Objective: Patient is well-developed, well-nourished  in no acute distress.  Resting comfortably  at home.  Head is normocephalic, atraumatic.  No labored breathing.  Speech is clear and coherent with logical content.  Patient is alert and oriented at baseline.    Assessment and Plan: 1. Anxiety (Primary)  2. Panic  Follow up with pcp. ED if sx worsen.   Follow Up Instructions: I discussed the assessment and treatment plan with the patient. The patient was provided an  opportunity to ask questions and all were answered. The patient agreed with the plan and demonstrated an understanding of the instructions.  A copy of instructions were sent to the patient via MyChart unless otherwise noted below.     The patient was advised to call back or seek an in-person evaluation if the symptoms worsen or if the condition fails to improve as anticipated.    [name], FNP

## 2023-09-23 NOTE — Patient Instructions (Signed)
 Panic Attack A panic attack is a sudden episode of severe anxiety, fear, or discomfort that causes physical and emotional symptoms. A panic attack may be in response to something frightening, or it may occur for no known reason. Symptoms of a panic attack can be similar to symptoms of a heart attack or stroke. It is important to see your health care provider when you have a panic attack so that these conditions can be ruled out. What are the causes? A panic attack may be caused by: An extreme, life-threatening situation, such as a war or natural disaster. An anxiety disorder, such as post-traumatic stress disorder. Depression. Panic disorder. Certain medical conditions, including heart problems, neurological conditions, and infections. Other causes may include: Certain over-the-counter and prescription medicines. Supplements that increase anxiety. Illegal drugs that increase heart rate and blood pressure, such as methamphetamine. What increases the risk? You are more likely to develop this condition if: You have another mental health condition. You use alcohol, illegal drugs, or other substances. You are under extreme stress. A life event is causing increased feelings of anxiety and depression. What are the signs or symptoms? A panic attack starts suddenly, usually lasts 5-10 minutes, and occurs with one or more of the following: A pounding heart, or a feeling that your heart is beating irregularly or faster than normal (palpitations). Sweating, trembling, or shaking. Shortness of breath, feeling smothered, or feeling choked. Chest pain or discomfort. Nausea or a strange feeling in your stomach. Dizziness, feeling light-headed, or feeling like you might faint. Other symptoms may include: Chills or hot flashes. Numbness or tingling in your lips, hands, or feet. Feeling confused, or feeling that you are not yourself. Fear of losing control or of being emotionally unstable, or fear of  dying. How is this diagnosed? A panic attack is diagnosed with an assessment by your health care provider. During the assessment, your health care provider will ask questions about: Your history of anxiety, depression, and panic attacks. Your medical history. Whether you drink alcohol, use drugs, take supplements, or take medicines. Be honest about your substance use. Your health care provider may also: Order blood tests or other kinds of tests to rule out serious medical conditions. Refer you to a mental health professional for further evaluation. How is this treated? A panic attack is a symptom of another condition. Treatment depends on the cause of the panic attack. If the cause is a medical problem, your health care provider will treat that problem or refer you to a specialist. If the cause is emotional, you may be given anti-anxiety medicines or referred to a counselor. Anti-anxiety medicines may reduce how often attacks happen, reduce how severe the attacks are, and lower anxiety. If the cause is a medicine, your health care provider may tell you to stop the medicine, change your dose, or take a different medicine. If the cause is an illegal drug, treatment may involve letting the drug wear off and taking medicine to help the drug leave your body or to stop its effects. Attacks caused by heavy drug use may continue even if you stop using the drug. Most panic attacks go away with treatment of the underlying problem. If you have panic attacks often, you may have a condition called panic disorder. Follow these instructions at home: Alcohol use Do not drink alcohol if: Your health care provider tells you not to drink. You are pregnant, may be pregnant, or are planning to become pregnant. If you drink alcohol: Limit how much  you have to: 0-1 drink a day for women. 0-2 drinks a day for men. Know how much alcohol is in your drink. In the [place] one drink equals one 12 oz bottle of beer (355  mL), one 5 oz glass of wine (148 mL), or one 1 oz glass of hard liquor (44 mL). General instructions Take over-the-counter and prescription medicines only as told by your health care provider. If you feel anxious, limit your caffeine intake. Take good care of your physical and mental health by: Eating a balanced diet that includes plenty of fresh fruits and vegetables, whole grains, lean meats, and low-fat dairy. Getting plenty of rest. Try to get 7-8 hours of uninterrupted sleep each night. Exercising regularly. Try to get 30 minutes of physical activity at least 5 days a week. Do not use any products that contain nicotine or tobacco. These products include cigarettes, chewing tobacco, and vaping devices, such as e-cigarettes. If you need help quitting, ask your health care provider. Keep all follow-up visits. This is important. Panic attacks may have underlying physical or emotional problems that take time to accurately diagnose. Where to find more information Substance Abuse and Mental Health Services [facility]): [employer] [facility]): http://www.maynard.net/ Contact a health care provider if: Your symptoms do not improve, or they get worse. You are not able to take your medicine as prescribed because of side effects. Get help right away if: You have thoughts about hurting yourself or others. Get help right away if you feel like you may hurt yourself or others, or have thoughts about taking your own life. Go to your nearest emergency room or: Call 911. Call the National Suicide Prevention Lifeline at (843) 481-9866 or 988. This is open 24 hours a day. Text the Crisis Text Line at (626) 513-0072. Summary A panic attack is a sudden episode of severe anxiety, fear, or discomfort that causes physical and emotional symptoms. Always see a health care provider to have the reasons for the panic attack correctly diagnosed. If your panic attack was caused by a  physical problem, follow your health care provider's suggestions for medicine, referral to a specialist, and lifestyle changes. If your panic attack was caused by an emotional problem, follow through with counseling from a qualified mental health specialist. If you feel like you may hurt yourself or others, call 911 and get help right away. This information is not intended to replace advice given to you by your health care provider. Make sure you discuss any questions you have with your health care provider. Document Revised: 11/20/2020 Document Reviewed: 11/20/2020 Elsevier Patient Education  2024 [employer].

## 2023-11-10 ENCOUNTER — Ambulatory Visit
Admission: EM | Admit: 2023-11-10 | Discharge: 2023-11-10 | Disposition: A | Discharge status: Home / Self Care | Attending: Emergency Medicine | Admitting: Emergency Medicine

## 2023-11-10 DIAGNOSIS — J45909 Unspecified asthma, uncomplicated: Secondary | ICD-10-CM

## 2023-11-10 DIAGNOSIS — J069 Acute upper respiratory infection, unspecified: Secondary | ICD-10-CM | POA: Diagnosis not present

## 2023-11-10 MED ORDER — PROMETHAZINE-DM 6.25-15 MG/5ML PO SYRP: 5.0000 mL | ORAL_SOLUTION | Freq: Four times a day (QID) | ORAL | 0 refills | Status: AC | PRN

## 2023-11-10 MED ORDER — BENZONATATE 100 MG PO CAPS: 200.0000 mg | ORAL_CAPSULE | Freq: Three times a day (TID) | ORAL | 0 refills | Status: AC

## 2023-11-10 MED ORDER — IPRATROPIUM BROMIDE 0.06 % NA SOLN: 2.0000 | Freq: Four times a day (QID) | NASAL | 12 refills | Status: AC

## 2023-11-10 MED ORDER — ALBUTEROL SULFATE HFA 108 (90 BASE) MCG/ACT IN AERS: 1.0000 | INHALATION_SPRAY | Freq: Four times a day (QID) | RESPIRATORY_TRACT | 2 refills | Status: AC | PRN

## 2023-11-10 NOTE — ED Triage Notes (Signed)
 Patient presents to [facility] for intermittent vomiting, sneezing, coughing, and nasal drainage since Monday. No OTC meds for symptom relief.

## 2023-11-10 NOTE — ED Provider Notes (Signed)
[facility]    CSN: [id number] Arrival date & time: 11/10/23  0951      History   Chief Complaint Chief Complaint  Patient presents with   Emesis    HPI YENTY BLOCH is a 23 y.o. female.   HPI  23 year old female with past medical history significant for asthma, ADHD, and anxiety presents for evaluation of intermittent flulike symptoms to include nasal congestion nasal drainage, ear pain, sneezing, nonproductive cough, shortness breath, and wheezing.  She is also had some intermittent vomiting.  She denies any fever or sore throat.  She has been using her inhaler which has helped with her shortness of breath and wheezing.  She denies any sick contacts.  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      Home Medications    Prior to Admission medications   Medication Sig Start Date End Date Taking? Authorizing Provider  benzonatate  (TESSALON ) 100 MG capsule Take 2 capsules (200 mg total) by mouth every 8 (eight) hours. 11/10/23  Yes Bernardino Ditch, NP  ipratropium (ATROVENT ) 0.06 % nasal spray Place 2 sprays into both nostrils 4 (four) times daily. 11/10/23  Yes Bernardino Ditch, NP  promethazine -dextromethorphan (PROMETHAZINE -DM) 6.25-15 MG/5ML syrup Take 5 mLs by mouth 4 (four) times daily as needed. 11/10/23  Yes Bernardino Ditch, NP  albuterol  (VENTOLIN  HFA) 108 (90 Base) MCG/ACT inhaler Inhale 1-2 puffs into the lungs every 6 (six) hours as  needed for wheezing or shortness of breath. 11/10/23   Bernardino Ditch, NP  budesonide -formoterol  (SYMBICORT ) 80-4.5 MCG/ACT inhaler Inhale 2 puffs into the lungs 2 (two) times daily. 08/02/22   Matthews, Jason J, MD  cetirizine (ZYRTEC) 10 MG tablet Take 10 mg by mouth daily as needed for allergies.    [provider]  diphenhydrAMINE HCl (BENADRYL ALLERGY PO) Take 1 tablet by mouth as needed.    [provider]    Family History Family History  Adopted: Yes  Problem Relation Age of Onset   Bipolar disorder Mother    OCD Mother    Drug abuse Mother    Breast cancer Paternal Grandmother    Skin cancer Paternal Grandmother    Heart attack Paternal Grandfather    Coronary artery disease Paternal Grandfather    Anxiety disorder Half-Sister    Diabetes Father    Cancer Paternal Aunt     Social History Social History   Tobacco Use   Smoking status: Never   Smokeless tobacco: Never  Vaping Use   Vaping status: Every Day  Substance Use Topics   Alcohol use: Not Currently   Drug use: Not Currently     Allergies   Iodine and Iodine   Review of Systems Review of Systems  Constitutional:  Negative for fever.  HENT:  Positive for congestion, ear pain, rhinorrhea and sneezing. Negative for sore throat.   Respiratory:  Positive for cough, shortness  of breath and wheezing.   Gastrointestinal:  Positive for nausea and vomiting. Negative for abdominal pain and diarrhea.     Physical Exam Triage Vital Signs ED Triage Vitals  Encounter Vitals Group     BP      Girls Systolic BP Percentile      Girls Diastolic BP Percentile      Boys Systolic BP Percentile      Boys Diastolic BP Percentile      Pulse      Resp      Temp      Temp src      SpO2      Weight      Height      Head Circumference      Peak Flow      Pain Score      Pain Loc      Pain Education      Exclude from Growth Chart    No data found.  Updated Vital Signs BP 115/78 (BP Location:  Left Arm)   Pulse 89   Temp 98.9 F (37.2 C) (Oral)   Resp 16   LMP 10/21/2023 (Approximate)   SpO2 100%   Visual Acuity Right Eye Distance:   Left Eye Distance:   Bilateral Distance:    Right Eye Near:   Left Eye Near:    Bilateral Near:     Physical Exam Vitals and nursing note reviewed.  Constitutional:      Appearance: Normal appearance. She is not ill-appearing.  HENT:     Head: Normocephalic and atraumatic.     Right Ear: Tympanic membrane, ear canal and external ear normal. There is no impacted cerumen.     Left Ear: Tympanic membrane, ear canal and external ear normal. There is no impacted cerumen.     Nose: Congestion and rhinorrhea present.     Comments: His mucosa is moderately edematous with clear discharge in both naris.    Mouth/Throat:     Mouth: Mucous membranes are moist.     Pharynx: Oropharynx is clear. No oropharyngeal exudate or posterior oropharyngeal erythema.  Cardiovascular:     Rate and Rhythm: Normal rate and regular rhythm.     Pulses: Normal pulses.     Heart sounds: Normal heart sounds. No murmur heard.    No friction rub. No gallop.  Pulmonary:     Effort: Pulmonary effort is normal.     Breath sounds: Normal breath sounds. No wheezing, rhonchi or rales.  Abdominal:     General: Abdomen is flat.     Palpations: Abdomen is soft.     Tenderness: There is no abdominal tenderness. There is no guarding or rebound.  Musculoskeletal:     Cervical back: Normal range of motion and neck supple. No tenderness.  Lymphadenopathy:     Cervical: No cervical adenopathy.  Skin:    General: Skin is warm and dry.     Capillary Refill: Capillary refill takes less than 2 seconds.     Findings: No rash.  Neurological:     General: No focal deficit present.     Mental Status: She is alert and oriented to person, place, and time.      UC Treatments / Results  Labs (all labs ordered are listed, but only abnormal results are displayed) Labs Reviewed -  No data to display  EKG   Radiology No results found.  Procedures Procedures (including critical care time)  Medications Ordered in UC Medications -  No data to display  Initial Impression / Assessment and Plan / UC Course  I have reviewed the triage vital signs and the nursing notes.  Pertinent labs & imaging results that were available during my care of the patient were reviewed by me and considered in my medical decision making (see chart for details).   Patient is a nontoxic-appearing 23 year old female presenting for evaluation of respiratory symptoms as outlined in the HPI above.  In the exam room she is not in any acute distress.  She is sitting on the exam table texting on her phone without any increased work of breathing.  She is able to speak in full sentence without dyspnea or tachypnea.  Her physical exam does reveal inflammation of her upper respiratory tract though her nasal discharge is clear.  Oropharyngeal exam is benign.  Cardiopulmonary exam reveals clear lung sounds in all fields.  Abdomen is soft, flat, nontender, and nondistended.  I suspect that the patient has a viral respiratory illness.  Differential diagnose include COVID, influenza, viral URI.  Due to the duration of symptoms I will not test her for COVID or influenza at this time.  I will have her continue to use her albuterol  inhaler as needed for shortness breath and wheezing, she does indicate that she needs a refill which I will provide.  I will also prescribe Atrovent  nasal spray for her congestion and sneezing.  Tessalon  Perles sent Promethazine  DM cough syrup for cough congestion.  Return precautions reviewed.  Work note provided.   Final Clinical Impressions(s) / UC Diagnoses   Final diagnoses:  Viral URI with cough     Discharge Instructions      As we discussed, your symptoms are most consistent with a viral respiratory infection.  Due to your duration of symptoms we have not tested you for COVID  or influenza as you are outside the therapeutic window for antivirals for both.  Continue to use your albuterol  inhaler as needed for any shortness of breath or wheezing.  You may take 1 to 2 puffs every 4-6 hours as needed.  Use the Atrovent  nasal spray, 2 squirts in each nostril every 6 hours, as needed for runny nose and postnasal drip.  Use the Tessalon  Perles every 8 hours during the day.  Take them with a small sip of water.  They may give you some numbness to the base of your tongue or a metallic taste in your mouth, this is normal.  Use the Promethazine  DM cough syrup at bedtime for cough and congestion.  It will make you drowsy so do not take it during the day.  Return for reevaluation or see your primary care provider for any new or worsening symptoms.      ED Prescriptions     Medication Sig Dispense Auth. Provider   albuterol  (VENTOLIN  HFA) 108 (90 Base) MCG/ACT inhaler Inhale 1-2 puffs into the lungs every 6 (six) hours as needed for wheezing or shortness of breath. 18 g Bernardino Ditch, NP   benzonatate  (TESSALON ) 100 MG capsule Take 2 capsules (200 mg total) by mouth every 8 (eight) hours. 21 capsule Bernardino Ditch, NP   ipratropium (ATROVENT ) 0.06 % nasal spray Place 2 sprays into both nostrils 4 (four) times daily. 15 mL Bernardino Ditch, NP   promethazine -dextromethorphan (PROMETHAZINE -DM) 6.25-15 MG/5ML syrup Take 5 mLs by mouth 4 (four) times daily as needed. 118 mL Bernardino Ditch, NP      PDMP not reviewed this encounter.   Bernardino Ditch, NP  11/10/23 1033  

## 2023-11-10 NOTE — Discharge Instructions (Signed)
 As we discussed, your symptoms are most consistent with a viral respiratory infection.  Due to your duration of symptoms we have not tested you for COVID or influenza as you are outside the therapeutic window for antivirals for both.  Continue to use your albuterol  inhaler as needed for any shortness of breath or wheezing.  You may take 1 to 2 puffs every 4-6 hours as needed.  Use the Atrovent  nasal spray, 2 squirts in each nostril every 6 hours, as needed for runny nose and postnasal drip.  Use the Tessalon  Perles every 8 hours during the day.  Take them with a small sip of water.  They may give you some numbness to the base of your tongue or a metallic taste in your mouth, this is normal.  Use the Promethazine  DM cough syrup at bedtime for cough and congestion.  It will make you drowsy so do not take it during the day.  Return for reevaluation or see your primary care provider for any new or worsening symptoms.
